# Patient Record
Sex: Female | Born: 1985 | Race: White | Hispanic: No | Marital: Single | Smoking: Never smoker
Health system: Southern US, Community
[De-identification: ages and names within clinical notes are randomized; demographics above are authoritative.]

---

## 2013-05-12 ENCOUNTER — Ambulatory Visit (INDEPENDENT_AMBULATORY_CARE_PROVIDER_SITE_OTHER): Payer: BC Managed Care – PPO | Admitting: Internal Medicine

## 2013-05-12 ENCOUNTER — Encounter (INDEPENDENT_AMBULATORY_CARE_PROVIDER_SITE_OTHER): Payer: Self-pay

## 2013-05-12 VITALS — BP 144/87 | HR 80 | Temp 98.8°F | Resp 20 | Ht 69.0 in | Wt 190.0 lb

## 2013-05-12 LAB — POCT URINALYSIS DIPSTIX (10)(MULTI-TEST)
Bilirubin, UA POCT: NEGATIVE
Glucose, UA POCT: NEGATIVE mg/dL
Ketones, UA POCT: NEGATIVE mg/dL
Nitrite, UA POCT: NEGATIVE
POCT Spec Gravity, UA: 1.01 (ref 1.001–1.035)
POCT pH, UA: 7 (ref 5–8)
Protein, UA POCT: NEGATIVE mg/dL
Urobilinogen, UA: 0.2 mg/dL

## 2013-05-12 MED ORDER — NITROFURANTOIN MONOHYD MACRO 100 MG PO CAPS
100.0000 mg | ORAL_CAPSULE | Freq: Two times a day (BID) | ORAL | Status: AC
Start: 2013-05-12 — End: 2013-05-17

## 2013-05-12 NOTE — Progress Notes (Signed)
Subjective:       Patient ID: Barbara Hess is a 27 y.o. female.    HPI  Chief Complaint   Patient presents with   . Urinary Tract Infection Symptoms     c/o symptoms started 05/10/13, with pain after urination, burning, urgency. patient has a history of UTI.       As above.  Dysuria, urgency and frequency.  No fever or chills.  1 episode of UTI in past.  No history of kidney infection.  No abd pain or flank pain.  Sexually active.    The following portions of the patient's history were reviewed and updated as appropriate: allergies, current medications, past family history, past medical history, past social history, past surgical history and problem list.    Review of Systems   Constitutional: Negative for fever, chills, activity change and appetite change.   Respiratory: Negative for shortness of breath.    Cardiovascular: Negative for chest pain.   Gastrointestinal: Negative for nausea, vomiting, abdominal pain, diarrhea and constipation.   Genitourinary: Positive for dysuria, urgency and frequency.   Neurological: Negative for dizziness, light-headedness and headaches.   All other systems reviewed and are negative.            Objective:     Physical Exam   Nursing note and vitals reviewed.  Constitutional: She appears well-developed and well-nourished.   Cardiovascular: Normal rate, regular rhythm, normal heart sounds and intact distal pulses.    No murmur heard.  Pulmonary/Chest: Effort normal and breath sounds normal. No respiratory distress. She has no wheezes. She has no rales.   Abdominal: Soft. Bowel sounds are normal. She exhibits no distension. There is no tenderness. There is no rebound and no guarding.   Genitourinary:        No CVA tenderness   Skin: Skin is warm and dry.     UA - large LE, trace blood      Assessment:       UTI      Plan:       Encouraged hydration.  Symptomatic care with cranberry juice/pyridium OTC as needed.  Antibiotics as prescribed.   Discussed warning symptoms such as  worsening pain, no improvement despite antibiotics, fever/chills, flank pain that would warrant immediate medical attention.  Education provided regarding genital hygiene and pathophysiology of UTI in women discussed.

## 2013-05-12 NOTE — Patient Instructions (Signed)
Bladder Infection, Female (Adult)    A bladder infection ("cystitis" or "UTI") usually causes a constant urge to urinate and a burning when passing urine. Urine may be cloudy, smelly or dark. There may be pain in the lower abdomen. A bladder infection occurs when bacteria from the vaginal area enter the bladder opening (urethra). This can occur from sexual intercourse, wearing tight clothing, dehydration and other factors.  Home Care:   Drink lots of fluids (at least 6-8 glasses a day, unless you must restrict fluids for other medical reasons). This will force the medicine into your urinary system and flush the bacteria out of your body.   Avoid sexual intercourse until your symptoms are gone.   Avoid caffeine, alcohol and spicy foods. These can irritate the bladder.   A bladder infection is treated with antibiotics. You may also be given Pyridium (generic = phenazopyridine) to reduce the burning sensation. This medicine will cause your urine to become a bright orange color. The orange urine may stain clothing. You may wear a pad or panty-liner to protect clothing.  Preventing Future Infections:   Always wipe from front to back after a bowel movement.   Keep the genital area clean and dry.   Drink plenty of fluids each day to avoid dehydration.   Both sexual partners should wash before intercourse.   Urinate right after intercourse to flush out the bladder.   Wear cotton underwear and cotton-lined panty hose; avoid tight-fitting pants.   If you are on birth control pills and are having frequent bladder infections, discuss with your doctor.  Follow Up:  Return to this facility or see your doctor if ALL symptoms are not gone after three days of treatment.  Get Prompt Medical Attention  if any of the following occur:   Fever of 100.4F (38C) or higher, or as directed by your healthcare provider   No improvement by the third day of treatment   Increasing back or abdominal pain   Repeated vomiting;  unable to keep medicine down   Weakness, dizziness or fainting   Vaginal discharge   Pain, redness or swelling in the labia (outer vaginal area)   2000-2014 [employer], [address], [place] [address]. All rights reserved. This information is not intended as a substitute for professional medical care. Always follow your healthcare professional's instructions.

## 2013-08-18 ENCOUNTER — Encounter (INDEPENDENT_AMBULATORY_CARE_PROVIDER_SITE_OTHER): Payer: Self-pay | Admitting: Family Medicine

## 2013-08-18 ENCOUNTER — Ambulatory Visit (INDEPENDENT_AMBULATORY_CARE_PROVIDER_SITE_OTHER): Payer: BC Managed Care – PPO | Admitting: Family Medicine

## 2013-08-18 VITALS — BP 142/89 | HR 81 | Temp 98.5°F | Resp 19 | Ht 69.0 in | Wt 202.6 lb

## 2013-08-18 MED ORDER — NORGESTIMATE-ETH ESTRADIOL 0.25-35 MG-MCG PO TABS
1.0000 | ORAL_TABLET | Freq: Every day | ORAL | Status: DC
Start: 2013-08-18 — End: 2014-02-10

## 2013-08-18 NOTE — Progress Notes (Signed)
Subjective:       Patient ID: Barbara Hess is a 27 y.o. female.    HPI    Barbara Hess is a 27 y.o. female who comes in today for refill of OC's. Just moved here from [facility]. [facility]  Gets nervous at new doctor's visits.    HPI        Active Problems  Patient Active Problem List    Diagnosis Date Noted   . Elevated BP 08/18/2013       Past Surgical History  History reviewed. No pertinent past surgical history.    Medications  Current Outpatient Prescriptions   Medication Sig Dispense Refill   . norgestimate-ethinyl estradiol (ORTHO-CYCLEN) 0.25-35 MG-MCG per tablet Take 1 tablet by mouth daily.           Allergies  No Known Allergies    Gyn History    Obstetric History   G0P0     Patient's last menstrual period was 08/15/2013.    Social History  History     Social History   . Marital Status: Single     Spouse Name: N/A     Number of Children: N/A   . Years of Education: 16     Occupational History   . Admin     Social History Main Topics   . Smoking status: Never Smoker    . Smokeless tobacco: Never Used   . Alcohol Use: 1.8 oz/week     3 Glasses of wine per week   . Drug Use: No   . Sexually Active: Yes     Birth Control/ Protection: Condom, OCP     Other Topics Concern   . Not on file     Social History Narrative   . No narrative on file       Family History  Family History   Problem Relation Age of Onset   . Hyperlipidemia Mother        Review of Systems  CONST: No wt change, no f/c/s  EYES:  No blurry vision, double vision, loss of peripheral vision  HENT:  No hearing loss, tinnitus, allergies, teeth problems  CV:   No CP, palpitations, leg swelling  RESP:  No SOB, cough, wheeze  GI:   No n/v, diarrhea, constipation, abd pain, heartburn, blood in stool  GU:   No dysuria, frequency, incontinence, breast changes, abnormal vaginal bleeding or discharge  MS:  No joint or muscle pain  SKIN:   No rashes or concerning moles  ENDO:  No heat/cold intolerance,  menstrual changes, polyuria, polydipsia  HEME:  No bruising/bleeding, swollen glands  NEURO:  No HA, LH, dizziness, numbness/tingling, weakness  PSYCH:  No depressed mood, anhedonia, anxiety            Review of Systems        Objective:    Physical Exam      Physical Exam:  BP 142/89  Pulse 81  Temp 98.5 F (36.9 C) (Oral)  Resp 19  Ht 1.753 m (5\' 9" )  Wt 91.899 kg (202 lb 9.6 oz)  BMI 29.91 kg/m2  SpO2 98%  LMP 08/15/2013  Wt Readings from Last 3 Encounters:   08/18/13 91.899 kg (202 lb 9.6 oz)   05/12/13 86.183 kg (190 lb)     BP Readings from Last 3 Encounters:   08/18/13 142/89   05/12/13 144/87     CONST: NAD, normal weight  HENT: B TMs normal, OP clear, normal dentition  EYES: PERRL, no pallor or scleral icterus, conjunctiva not injected  NECK: supple, no TM or nodules  LYMPH: no cervical or supraclavicular LAD  CV: RRR, no m/r/g.  No LE edema.  Pedal pulses present and equal.  RESP: CTAB, normal effort  GI: soft, NT/ND, NABS, no HSM  SKIN: warm, dry.  no visible rashes or concerning moles  MSK: Grossly normal ROM and strength in all 4 extremities  NEURO: Cranial nerves 2-12 intact, normal gait  PSYCH: A&O x3, normal mood and affect    Assessment:       1. Elevated BP     2. Birth control  norgestimate-ethinyl estradiol (ORTHO-CYCLEN) 0.25-35 MG-MCG per tablet           Plan:       Risks & benefits of the new medication(s) were explained to the pt, who appeared to understand and agrees to the treatment plan.  Return for bp check

## 2013-08-18 NOTE — Progress Notes (Signed)
Has the patient sought any care outside of the [facility]? NO    -Refer to care team-   Or  List Specialists:

## 2013-08-18 NOTE — Patient Instructions (Signed)
Eating Heart-Healthy Food: Using the DASH Plan  Eating for your heart doesn't have to be hard or boring. You just need to know how to make healthier choices. The DASH eating plan has been developed to help you do just that. DASH stands for Dietary Approaches to Stop Hypertension. It is a plan that has been proven to be healthier for your heart and to lower your risk for high blood pressure. It can also help lower your risk for cancer, heart disease, osteoporosis, and diabetes.    Choosing from Each Food Group  Choose foods from each of the food groups below each day. Try to get the recommended number of servings for each food group. The serving numbers are based on a diet of 2,000 calories a day.Talk to your doctor if you're unsure about your calorie needs.      Grains  Servings: 7-8 a day  A serving is:   1 slice bread   1 ounce dry cereal   half a cup cooked rice or pasta  Best choices: Whole grains and any grains high in fiber. Vegetables  Servings: 4-5 a day  A serving is:   1 cup raw leafy vegetable   Half a cup cooked vegetable   Three-quarter cup vegetable juice  Best choices: Fresh or frozen vegetable prepared without too much added salt or fat.   Fruits  Servings: 4-5 a day  A serving is:   Three-quarter cup fruit juice   1 medium fruit   One-quarter cup dried fruit   One-half cup fresh, frozen, or canned fruit  Best choices: A variety of fresh fruits of different colors. Whole fruits are a much better choice than fruit juices. Low-fat or Fat Free Dairy  Servings: 2-3 a day  A serving is:   8 ounces milk   1 cup yogurt   One and a half ounces cheese  Best choices: Skim or 1% milk, low-fat or fat free yogurt or buttermilk, and low-fat cheeses.             Meat, Poultry, Fish  Servings: 2 or fewer a day  A serving is:   3 ounces cooked meat, poultry, or fish  Best choices: Lean meats and fish. Trim away visible fat. Broil, roast, or boil instead of frying. Remove skin from poultry  before eating. Nuts, Seeds, Beans  Servings: 4-5 a week  A serving is:   One third cup nuts (or one and a half ounces)   2 tablespoons sunflower seeds   Half a cup cooked beans  Best choices: "Dry roasted" nuts with no salt added, lentils, kidney beans, garbanzo beans, and whole pinto beans.   Fats and Oils  Servings: 2 a day  A serving is:   1 teaspoon vegetable oil   1 teaspoon soft margarine   1 tablespoon low-fat mayonnaise   1 teaspoon regular mayonnaise   2 tablespoons light salad dressing   1 tablespoon regular salad dressing  Best choices: Monounsaturated and polyunsaturated fats such as olive, canola, or safflower oil. Sweets  Servings: 5 a week or fewer  A serving is:   1 tablespoon sugar, maple syrup, or honey   1 tablespoon jam or jelly   1 half-ounce jelly beans (about 15)   8 ounces lemonade  Best choices: Dried fruit can be a satisfying sweet. Choose low-fat sweets when possible. And watch your serving sizes!     For more on the DASH eating plan, visit:  www.nhlbi.nih.gov/health/health-topics/topics/dash      2000-2014 [employer], [address], [place] [address]. All rights reserved. This information is not intended as a substitute for professional medical care. Always follow your healthcare professional's instructions.

## 2013-08-31 ENCOUNTER — Other Ambulatory Visit (INDEPENDENT_AMBULATORY_CARE_PROVIDER_SITE_OTHER): Payer: Self-pay | Admitting: Family Medicine

## 2013-08-31 NOTE — Telephone Encounter (Signed)
Patient would like to know if there is a way we can send in the brand name for  norgestimate-ethinyl estradiol (ORTHO-CYCLEN) which is Mononessa.  Dr Casimer Leek has sent in the generic but patient would recommend the brand rx instead.  Please let me know if you could do it, if so then please [facility].

## 2014-02-09 ENCOUNTER — Telehealth (INDEPENDENT_AMBULATORY_CARE_PROVIDER_SITE_OTHER): Payer: Self-pay | Admitting: Family Medicine

## 2014-02-09 NOTE — Telephone Encounter (Signed)
Pt is calling to get a refill on the following medication norgestimate-ethinyl estradiol (ORTHO-CYCLEN) 0.25-35 MG-MCG per tablet. She would like for it to be sent to [facility] [id number] - [place], [place] - [address] AT [facility] [phone] (Phone)  [phone] (Fax).

## 2014-02-10 ENCOUNTER — Other Ambulatory Visit (INDEPENDENT_AMBULATORY_CARE_PROVIDER_SITE_OTHER): Payer: Self-pay | Admitting: Family Medicine

## 2014-02-10 DIAGNOSIS — IMO0001 Reserved for inherently not codable concepts without codable children: Secondary | ICD-10-CM

## 2014-02-10 MED ORDER — NORGESTIMATE-ETH ESTRADIOL 0.25-35 MG-MCG PO TABS
1.00 | ORAL_TABLET | Freq: Every day | ORAL | Status: AC
Start: 2014-02-10 — End: ?

## 2014-02-13 NOTE — Telephone Encounter (Deleted)
Pt requesting the (norgestimate-ethinyl estradiol (ORTHO-CYCLEN) 0.25-35 MG-MCG per tablet) be sent to   [facility]   [address] [place], [place] [address]  [phone]  [facility] is trying to give Patient a different type of Birth Control Rx

## 2020-02-23 ENCOUNTER — Emergency Department (HOSPITAL_COMMUNITY): Payer: Managed Care, Other (non HMO)

## 2020-02-23 ENCOUNTER — Encounter (HOSPITAL_COMMUNITY): Payer: Self-pay | Admitting: Emergency Medicine

## 2020-02-23 ENCOUNTER — Other Ambulatory Visit: Payer: Self-pay

## 2020-02-23 ENCOUNTER — Emergency Department (HOSPITAL_COMMUNITY)
Admission: EM | Admit: 2020-02-23 | Discharge: 2020-02-23 | Disposition: A | Discharge status: Home / Self Care | Payer: Managed Care, Other (non HMO) | Attending: Emergency Medicine | Admitting: Emergency Medicine

## 2020-02-23 DIAGNOSIS — Y93E1 Activity, personal bathing and showering: Secondary | ICD-10-CM | POA: Insufficient documentation

## 2020-02-23 DIAGNOSIS — S0990XA Unspecified injury of head, initial encounter: Secondary | ICD-10-CM | POA: Diagnosis not present

## 2020-02-23 DIAGNOSIS — Y999 Unspecified external cause status: Secondary | ICD-10-CM | POA: Diagnosis not present

## 2020-02-23 DIAGNOSIS — W010XXA Fall on same level from slipping, tripping and stumbling without subsequent striking against object, initial encounter: Secondary | ICD-10-CM | POA: Insufficient documentation

## 2020-02-23 DIAGNOSIS — S0181XA Laceration without foreign body of other part of head, initial encounter: Secondary | ICD-10-CM | POA: Diagnosis present

## 2020-02-23 DIAGNOSIS — R030 Elevated blood-pressure reading, without diagnosis of hypertension: Secondary | ICD-10-CM

## 2020-02-23 DIAGNOSIS — Y92002 Bathroom of unspecified non-institutional (private) residence single-family (private) house as the place of occurrence of the external cause: Secondary | ICD-10-CM | POA: Insufficient documentation

## 2020-02-23 DIAGNOSIS — I10 Essential (primary) hypertension: Secondary | ICD-10-CM | POA: Insufficient documentation

## 2020-02-23 DIAGNOSIS — W19XXXA Unspecified fall, initial encounter: Secondary | ICD-10-CM

## 2020-02-23 MED ORDER — ACETAMINOPHEN 500 MG PO TABS
1000.0000 mg | ORAL_TABLET | Freq: Once | ORAL | Status: AC
  Administered 2020-02-23: 1000 mg via ORAL
  Filled 2020-02-23: qty 2

## 2020-02-23 MED ORDER — LIDOCAINE-EPINEPHRINE-TETRACAINE (LET) TOPICAL GEL
3.0000 mL | Freq: Once | TOPICAL | Status: AC
  Administered 2020-02-23: 3 mL via TOPICAL
  Filled 2020-02-23: qty 3

## 2020-02-23 MED ORDER — IBUPROFEN 800 MG PO TABS: 800.0000 mg | ORAL_TABLET | Freq: Three times a day (TID) | ORAL | 0 refills | Status: DC

## 2020-02-23 NOTE — ED Notes (Signed)
Dr. Palumbo at bedside. 

## 2020-02-23 NOTE — ED Notes (Signed)
Dr. Nicanor Alcon aware of pt BP on discharge.

## 2020-02-23 NOTE — ED Triage Notes (Signed)
Patient fell in shower and hit her head. Patient has a laceration on the front neck.

## 2020-02-23 NOTE — ED Provider Notes (Signed)
[place] DEPT Provider Note   CSN: [id number] Arrival date & time: 02/23/20  0045     History Chief Complaint  Patient presents with  . Head Laceration    Majorie Kim is a 34 y.o. female.  The history is provided by the patient.  Head Laceration This is a new problem. The current episode started less than 1 hour ago. The problem occurs constantly. The problem has not changed since onset.Pertinent negatives include no chest pain, no abdominal pain, no headaches and no shortness of breath. Nothing aggravates the symptoms. Nothing relieves the symptoms. She has tried nothing for the symptoms. The treatment provided no relief.  Patient with reported "white coat hypertension" presents with fall in the shower this evening.  She struck her head.  No LOC.  Laceration over the left eyebrow.  No vomiting.  No seizure like activity.      History reviewed. No pertinent past medical history.  There are no problems to display for this patient.   History reviewed. No pertinent surgical history.   OB History   No obstetric history on file.     History reviewed. No pertinent family history.  Social History   Tobacco Use  . Smoking status: Never Smoker  . Smokeless tobacco: Never Used  Substance Use Topics  . Alcohol use: Not on file  . Drug use: Not on file    Home Medications Prior to Admission medications   Not on File    Allergies    Patient has no known allergies.  Review of Systems   Review of Systems  Constitutional: Negative for fever.  HENT: Negative for congestion.   Eyes: Negative for visual disturbance.  Respiratory: Negative for shortness of breath.   Cardiovascular: Negative for chest pain.  Gastrointestinal: Negative for abdominal pain.  Genitourinary: Negative for difficulty urinating.  Musculoskeletal: Negative for back pain and neck pain.  Skin: Positive for wound. Negative for rash.  Neurological: Negative for  seizures, speech difficulty, weakness, numbness and headaches.  Psychiatric/Behavioral: Negative for agitation.  All other systems reviewed and are negative.   Physical Exam Updated Vital Signs BP (!) 197/123 (BP Location: Left Arm)   Pulse 92   Temp 98.1 F (36.7 C) (Oral)   Resp 20   Ht 5\' 9"  (1.753 m)   Wt 99.8 kg   SpO2 98%   BMI 32.49 kg/m   Physical Exam Vitals and nursing note reviewed.  Constitutional:      General: She is not in acute distress.    Appearance: Normal appearance.  HENT:     Head: Normocephalic. No raccoon eyes or Battle's sign.     Jaw: No trismus.      Nose: Nose normal.  Eyes:     Extraocular Movements: Extraocular movements intact.     Conjunctiva/sclera: Conjunctivae normal.     Pupils: Pupils are equal, round, and reactive to light.  Cardiovascular:     Rate and Rhythm: Normal rate and regular rhythm.     Pulses: Normal pulses.     Heart sounds: Normal heart sounds.  Pulmonary:     Effort: Pulmonary effort is normal.     Breath sounds: Normal breath sounds.  Abdominal:     General: Abdomen is flat. Bowel sounds are normal.     Tenderness: There is no abdominal tenderness. There is no guarding or rebound.  Musculoskeletal:        General: Normal range of motion.     Cervical back: Normal  range of motion and neck supple.  Skin:    General: Skin is warm and dry.     Capillary Refill: Capillary refill takes less than 2 seconds.  Neurological:     General: No focal deficit present.     Mental Status: She is alert and oriented to person, place, and time.     Deep Tendon Reflexes: Reflexes normal.  Psychiatric:        Mood and Affect: Mood normal.        Behavior: Behavior normal.     ED Results / Procedures / Treatments   Labs (all labs ordered are listed, but only abnormal results are displayed) Labs Reviewed - No data to display  EKG None  Radiology CT Head Wo Contrast  Result Date: 02/23/2020 CLINICAL DATA:  Fall insert  however, laceration to the left forehead EXAM: CT HEAD WITHOUT CONTRAST TECHNIQUE: Contiguous axial images were obtained from the base of the skull through the vertex without intravenous contrast. COMPARISON:  None. FINDINGS: Brain: No evidence of acute territorial infarction, hemorrhage, hydrocephalus,extra-axial collection or mass lesion/mass effect. Normal gray-white differentiation. Ventricles are normal in size and contour. Vascular: No hyperdense vessel or unexpected calcification. Skull: The skull is intact. No fracture or focal lesion identified. Sinuses/Orbits: The visualized paranasal sinuses and mastoid air cells are clear. The orbits and globes intact. Other: None Cervical spine: Alignment: Physiologic Skull base and vertebrae: Visualized skull base is intact. No atlanto-occipital dissociation. The vertebral body heights are well maintained. No fracture or pathologic osseous lesion seen. Soft tissues and spinal canal: The visualized paraspinal soft tissues are unremarkable. No prevertebral soft tissue swelling is seen. The spinal canal is grossly unremarkable, no large epidural collection or significant canal narrowing. Disc levels:  No significant canal or neural foraminal narrowing. Upper chest: The lung apices are clear. Thoracic inlet is within normal limits. Other: None IMPRESSION: No acute intracranial abnormality. No acute fracture or malalignment of the spine. Electronically Signed   By: Jonna Clark M.D.   On: 02/23/2020 02:18   CT Cervical Spine Wo Contrast  Result Date: 02/23/2020 CLINICAL DATA:  Fall insert however, laceration to the left forehead EXAM: CT HEAD WITHOUT CONTRAST TECHNIQUE: Contiguous axial images were obtained from the base of the skull through the vertex without intravenous contrast. COMPARISON:  None. FINDINGS: Brain: No evidence of acute territorial infarction, hemorrhage, hydrocephalus,extra-axial collection or mass lesion/mass effect. Normal gray-white  differentiation. Ventricles are normal in size and contour. Vascular: No hyperdense vessel or unexpected calcification. Skull: The skull is intact. No fracture or focal lesion identified. Sinuses/Orbits: The visualized paranasal sinuses and mastoid air cells are clear. The orbits and globes intact. Other: None Cervical spine: Alignment: Physiologic Skull base and vertebrae: Visualized skull base is intact. No atlanto-occipital dissociation. The vertebral body heights are well maintained. No fracture or pathologic osseous lesion seen. Soft tissues and spinal canal: The visualized paraspinal soft tissues are unremarkable. No prevertebral soft tissue swelling is seen. The spinal canal is grossly unremarkable, no large epidural collection or significant canal narrowing. Disc levels:  No significant canal or neural foraminal narrowing. Upper chest: The lung apices are clear. Thoracic inlet is within normal limits. Other: None IMPRESSION: No acute intracranial abnormality. No acute fracture or malalignment of the spine. Electronically Signed   By: Jonna Clark M.D.   On: 02/23/2020 02:18    Procedures .Marland KitchenLaceration Repair  Date/Time: 02/23/2020 3:08 AM Performed by: Cy Blamer, MD Authorized by: Cy Blamer, MD   Consent:  Consent obtained:  Verbal   Consent given by:  Patient   Risks discussed:  Infection, need for additional repair, nerve damage, pain, poor cosmetic result, poor wound healing, tendon damage, vascular damage and retained foreign body   Alternatives discussed:  No treatment and delayed treatment Anesthesia (see MAR for exact dosages):    Anesthesia method:  Topical application   Topical anesthetic:  LET Laceration details:    Location:  Face   Face location:  Forehead (just above the left eyebrow)   Length (cm):  3   Depth (mm):  1 Repair type:    Repair type:  Simple Pre-procedure details:    Preparation:  Patient was prepped and draped in usual sterile  fashion Exploration:    Hemostasis achieved with:  Direct pressure and LET   Wound exploration: wound explored through full range of motion     Wound extent: no areolar tissue violation noted     Contaminated: no   Treatment:    Wound cleansed with: chlorhexidine.   Amount of cleaning:  Extensive Skin repair:    Repair method:  Tissue adhesive Approximation:    Approximation:  Close Post-procedure details:    Dressing:  Sterile dressing   Patient tolerance of procedure:  Tolerated well, no immediate complications   (including critical care time)  Medications Ordered in ED Medications  lidocaine-EPINEPHrine-tetracaine (LET) topical gel (3 mLs Topical Given 02/23/20 0121)  acetaminophen (TYLENOL) tablet 1,000 mg (1,000 mg Oral Given 02/23/20 0121)    ED Course  I have reviewed the triage vital signs and the nursing notes.  Pertinent labs & imaging results that were available during my care of the patient were reviewed by me and considered in my medical decision making (see chart for details).   Wound with excellent approximation.  Dressing applied.  Alternate tylenol and ibuprofen.  I have advised the patient to follow up with PMD for "white coat HTN" as her BP needs to be checked to ensure this is not essential hypertension.  Patient verbalizes understanding and agrees to follow up.    Mellissa Conley was evaluated in Emergency Department on 02/23/2020 for the symptoms described in the history of present illness. She was evaluated in the context of the global COVID-19 pandemic, which necessitated consideration that the patient might be at risk for infection with the SARS-CoV-2 virus that causes COVID-19. Institutional protocols and algorithms that pertain to the evaluation of patients at risk for COVID-19 are in a state of rapid change based on information released by regulatory bodies including the CDC and federal and state organizations. These policies and algorithms were followed  during the patient's care in the ED.  Final Clinical Impression(s) / ED Diagnoses Final diagnoses:  Laceration of other part of head without foreign body, initial encounter  Fall, initial encounter   Return for weakness, numbness, changes in vision or speech, fevers >100.4 unrelieved by medication, shortness of breath, intractable vomiting, or diarrhea, abdominal pain, Inability to tolerate liquids or food, cough, altered mental status or any concerns. No signs of systemic illness or infection. The patient is nontoxic-appearing on exam and vital signs are within normal limits.   I have reviewed the triage vital signs and the nursing notes. Pertinent labs &imaging results that were available during my care of the patient were reviewed by me and considered in my medical decision making (see chart for details).  After history, exam, and medical workup I feel the patient has been appropriately medically screened and is safe  for discharge home. Pertinent diagnoses were discussed with the patient. Patient was givenstrictreturn precautions.   Jonnie Kubly, MD 02/23/20 [phone]

## 2020-02-23 NOTE — ED Notes (Signed)
Pt ambulatory to and from bathroom with a steady gait.  

## 2020-02-27 ENCOUNTER — Encounter: Payer: Self-pay | Admitting: Plastic Surgery

## 2020-02-27 ENCOUNTER — Other Ambulatory Visit: Payer: Self-pay

## 2020-02-27 ENCOUNTER — Ambulatory Visit: Payer: Managed Care, Other (non HMO) | Admitting: Plastic Surgery

## 2020-02-27 VITALS — BP 174/124 | HR 90 | Temp 97.3°F | Ht 69.0 in | Wt 226.0 lb

## 2020-02-27 DIAGNOSIS — S0181XA Laceration without foreign body of other part of head, initial encounter: Secondary | ICD-10-CM | POA: Diagnosis not present

## 2020-02-27 NOTE — Progress Notes (Signed)
Referring Provider No referring provider defined for this encounter.   CC:  Chief Complaint  Patient presents with  . Advice Only    for scar on forehead      Monique Kim is an 34 y.o. female.  HPI: Patient is about 5 days out from a laceration to her left forehead.  She slipped in the bathroom and hit her head on a tile.  She stained a laceration was seen in the emergency room where Dermabond was applied.  Since then she has had intermittent bleeding and quite a bit of scabbing is formed over the area.  She feels like the bruising and swelling has gone down and has some degree of pain but is primarily concerned with the degree of scarring and is interested in discussing ways to minimize this if possible.  No Known Allergies  Outpatient Encounter Medications as of 02/27/2020  Medication Sig  . clonazePAM (KLONOPIN) 0.5 MG tablet Take 0.5 mg by mouth daily as needed.  Marland Kitchen ibuprofen (ADVIL) 800 MG tablet Take 1 tablet (800 mg total) by mouth 3 (three) times daily.  . Multiple Vitamin (MULTIVITAMIN) tablet Take 2 tablets by mouth daily.  . norgestimate-ethinyl estradiol (SPRINTEC 28) 0.25-35 MG-MCG tablet Take 1 tablet by mouth daily.  Marland Kitchen VIIBRYD 20 MG TABS Take 30mg  tablet in the morning.   No facility-administered encounter medications on file as of 02/27/2020.     No past medical history on file.  No past surgical history on file.  No family history on file.  Social History   Social History Narrative  . Not on file     Review of Systems General: Denies fevers, chills, weight loss CV: Denies chest pain, shortness of breath, palpitations  Physical Exam Vitals with BMI 02/27/2020 02/27/2020 02/23/2020  Height - 5\' 9"  -  Weight - 226 lbs -  BMI - 79.02 -  Systolic 409 735 329  Diastolic 924 268 341  Pulse - 90 88    General:  No acute distress,  Alert and oriented, Non-Toxic, Normal speech and affect HEENT: Normocephalic.  She has a 4 to 4.5 cm transverse laceration  superior to and parallel to the left eyebrow.  There is quite a bit of adherent Dermabond and scab some of which I removed to try to get a better look at the laceration line.  She can raise her eyebrow on both sides.  There is a small amount of bruising that looks to be fading nicely.  She reports normal vision and normal extraocular movements.  There is no other signs of trauma to her face.  Assessment/Plan Had a long discussion with the patient regarding the scarring process and how to move forward.  She did show me a picture of the laceration shortly after it was glued and this appeared to be reasonably well approximated.  It is a bit difficult to get a good look at the laceration edges today given the Dermabond and scabbing.  I elected not to aggressively remove this for fear of separating the wound edges.  I explained that within a couple weeks most of the Dermabond and scabbing should flake away.  This will reveal a fresh scar that will take time to mature and fade.  I discussed the process with her.  I explained that in the short-term sunscreen and silicone scar creams would be helpful.  I explained it would be unlikely that I would entertain any sort of revision procedure for least 6 months and probably longer.  Patient can come back at any time am happy to look at this again in the future if she is interested.  Monique Kim 02/27/2020, 8:57 PM

## 2020-05-17 ENCOUNTER — Ambulatory Visit (HOSPITAL_COMMUNITY)
Admission: EM | Admit: 2020-05-17 | Discharge: 2020-05-17 | Disposition: A | Discharge status: Home / Self Care | Payer: Managed Care, Other (non HMO) | Attending: Urgent Care | Admitting: Urgent Care

## 2020-05-17 ENCOUNTER — Encounter (HOSPITAL_COMMUNITY): Payer: Self-pay

## 2020-05-17 ENCOUNTER — Ambulatory Visit (HOSPITAL_COMMUNITY): Payer: Self-pay

## 2020-05-17 ENCOUNTER — Other Ambulatory Visit: Payer: Self-pay

## 2020-05-17 DIAGNOSIS — R030 Elevated blood-pressure reading, without diagnosis of hypertension: Secondary | ICD-10-CM | POA: Diagnosis present

## 2020-05-17 DIAGNOSIS — I10 Essential (primary) hypertension: Secondary | ICD-10-CM | POA: Diagnosis present

## 2020-05-17 DIAGNOSIS — R635 Abnormal weight gain: Secondary | ICD-10-CM | POA: Diagnosis present

## 2020-05-17 LAB — COMPREHENSIVE METABOLIC PANEL
ALT: 14 U/L (ref 0–44)
AST: 22 U/L (ref 15–41)
Albumin: 3.5 g/dL (ref 3.5–5.0)
Alkaline Phosphatase: 44 U/L (ref 38–126)
Anion gap: 11 (ref 5–15)
BUN: 11 mg/dL (ref 6–20)
CO2: 26 mmol/L (ref 22–32)
Calcium: 9.2 mg/dL (ref 8.9–10.3)
Chloride: 101 mmol/L (ref 98–111)
Creatinine, Ser: 0.97 mg/dL (ref 0.44–1.00)
GFR calc Af Amer: >60 mL/min (ref >60)
GFR calc non Af Amer: >60 mL/min (ref >60)
Glucose, Bld: 117 mg/dL — ABNORMAL HIGH (ref 70–99)
Potassium: 3.4 mmol/L — ABNORMAL LOW (ref 3.5–5.1)
Sodium: 138 mmol/L (ref 135–145)
Total Bilirubin: 1 mg/dL (ref 0.3–1.2)
Total Protein: 7.1 g/dL (ref 6.5–8.1)

## 2020-05-17 MED ORDER — AMLODIPINE BESYLATE 5 MG PO TABS: 5.0000 mg | ORAL_TABLET | Freq: Every day | ORAL | 0 refills | Status: AC

## 2020-05-17 NOTE — Discharge Instructions (Addendum)
Check your blood pressure daily around the same time each day. Make sure you are in a seated position, relaxed and still for ~5 minutes. If you develop chest pain, headache, confusion, blood in the urine then report to the ER immediately by ambulance or if your symptoms are not severe then by having your husband drive you there. Your blood pressure should be 110-130 systolic as a goal. As we start you on medication we want to bring it down slowly and add medications on until you are at a good level. I would say, after a week if you have no symptoms from taking the medication then increase your amlodipine dose to 10mg  (2 tablets daily).     For diabetes or elevated blood sugar, please make sure you are avoiding starchy, carbohydrate foods like pasta, breads, pastry, rice, potatoes, desserts. These foods can elevated your blood sugar. Also, avoid sodas, sweet teas, sugary beverages, fruit juices.  Drinking plain water will be much more helpful, try 64 ounces of water daily.  It is okay to flavor your water naturally by cutting cucumber, lemon, mint or lime, placing it in a picture with water and drinking it over a period of 2 to 3 days as long as it remains refrigerated.    For elevated blood pressure, make sure you are monitoring salt in your diet.  Do not eat restaurant foods and limit processed foods at home, prepare/cook your own foods at home.  Processed foods include things like frozen meals preseasoned meats and dinners, deli meats, canned foods as they are high in sodium/salt.  Make sure your pain attention to sodium labels on foods you by at the grocery store.  For seasoning you can use a brand called Mrs. Dash which includes a lot of salt free seasonings.  Salads - kale, spinach, cabbage, spring mix; use seeds like pumpkin seeds or sunflower seeds, almonds, walnuts or pecans; you can also use 1-2 hard boiled eggs in your salads Fruits - avocadoes, berries (blueberries, raspberries, blackberries),  apples, oranges, pomegranate, pear; avoid eating bananas, grapes regularly Vegetables - aspargus, cauliflower, broccoli, green beans, brussel spouts, bell peppers; stay away from starchy vegetables like potatoes, carrots, peas  Regarding meat it is better to eat lean meats and limit your red meat including pork to once a week.  Wild caught fish, chicken breast are good options as they tend to be leaner sources of good protein.   DO NOT EAT ANY FOODS ON THIS LIST THAT YOU ARE ALLERGIC TO.

## 2020-05-17 NOTE — ED Provider Notes (Signed)
[facility]   MRN: [id number] DOB: 19-May-1986  Subjective:   Monique Kim is a 34 y.o. female presenting for check on her blood pressure.  Patient was at her gynecologist for regular checkup, refill of her birth control and was noted to have severely elevated blood pressure.  She does not have a PCP since moving down here from [place] recently.  The emphasized coming to our clinic to have an evaluation.  Patient states that over the past year she has been under a lot of stress with anxiety and depression, COVID-19 pandemic and quarantining.  Has gained 30 to 40 pounds.  Was previously very physically active and exercise regularly but this also affected her since she stopped working out.  No current facility-administered medications for this encounter.  Current Outpatient Medications:  .  clonazePAM (KLONOPIN) 0.5 MG tablet, Take 0.5 mg by mouth daily as needed., Disp: , Rfl:  .  ibuprofen (ADVIL) 800 MG tablet, Take 1 tablet (800 mg total) by mouth 3 (three) times daily., Disp: 21 tablet, Rfl: 0 .  Multiple Vitamin (MULTIVITAMIN) tablet, Take 2 tablets by mouth daily., Disp: , Rfl:  .  norgestimate-ethinyl estradiol (SPRINTEC 28) 0.25-35 MG-MCG tablet, Take 1 tablet by mouth daily., Disp: , Rfl:  .  VIIBRYD 20 MG TABS, Take 30mg  tablet in the morning., Disp: , Rfl:    No Known Allergies  PMH anxiety.   History reviewed. No pertinent surgical history.  Family History  Family history unknown: Yes    Social History   Tobacco Use  . Smoking status: Never Smoker  . Smokeless tobacco: Never Used  Substance Use Topics  . Alcohol use: Not on file  . Drug use: Not on file    Review of Systems  Constitutional: Negative for fever and malaise/fatigue.  HENT: Negative for congestion, ear pain, sinus pain and sore throat.   Eyes: Negative for blurred vision, double vision, discharge and redness.  Respiratory: Negative for cough, hemoptysis, shortness of breath and wheezing.     Cardiovascular: Negative for chest pain.  Gastrointestinal: Negative for abdominal pain, diarrhea, nausea and vomiting.  Genitourinary: Negative for dysuria, flank pain and hematuria.  Musculoskeletal: Negative for myalgias.  Skin: Negative for rash.  Neurological: Negative for dizziness, tingling, tremors, sensory change, speech change, focal weakness, loss of consciousness, weakness and headaches.  Psychiatric/Behavioral: Negative for depression and substance abuse.     Objective:   Vitals: BP (!) 192/119 (BP Location: Left Arm)   Pulse (!) 103   Temp 98.6 F (37 C) (Oral)   Resp 18   LMP 05/02/2020   SpO2 98%   Wt Readings from Last 3 Encounters:  02/27/20 226 lb (102.5 kg)  02/23/20 220 lb (99.8 kg)   Temp Readings from Last 3 Encounters:  05/17/20 98.6 F (37 C) (Oral)  02/27/20 (!) 97.3 F (36.3 C) (Temporal)  02/23/20 98.1 F (36.7 C) (Oral)   BP Readings from Last 3 Encounters:  05/17/20 (!) 192/119  02/27/20 (!) 174/124  02/23/20 (!) 178/100   Pulse Readings from Last 3 Encounters:  05/17/20 (!) 103  02/27/20 90  02/23/20 88   Physical Exam Constitutional:      General: She is not in acute distress.    Appearance: Normal appearance. She is well-developed. She is obese. She is not ill-appearing, toxic-appearing or diaphoretic.  HENT:     Head: Normocephalic and atraumatic.     Nose: Nose normal.     Mouth/Throat:     Mouth:  Mucous membranes are moist.  Eyes:     Extraocular Movements: Extraocular movements intact.     Pupils: Pupils are equal, round, and reactive to light.  Cardiovascular:     Rate and Rhythm: Normal rate and regular rhythm.     Pulses: Normal pulses.     Heart sounds: Normal heart sounds. No murmur heard.  No friction rub. No gallop.   Pulmonary:     Effort: Pulmonary effort is normal. No respiratory distress.     Breath sounds: Normal breath sounds. No stridor. No wheezing, rhonchi or rales.  Musculoskeletal:        General:  Normal range of motion.     Right lower leg: No edema.     Left lower leg: No edema.  Skin:    General: Skin is warm and dry.     Findings: No rash.  Neurological:     Mental Status: She is alert and oriented to person, place, and time.     Cranial Nerves: No cranial nerve deficit.     Motor: No weakness.     Coordination: Coordination normal.     Gait: Gait normal.     Deep Tendon Reflexes: Reflexes normal.  Psychiatric:        Mood and Affect: Mood normal.        Behavior: Behavior normal.        Thought Content: Thought content normal.        Judgment: Judgment normal.      Assessment and Plan :   PDMP not reviewed this encounter.  1. Essential hypertension   2. Elevated blood pressure reading   3. Weight gain     Had extensive discussion with patient about severely elevated blood pressures and borderline tachycardia.  At this time patient does not have any physical exam findings of an acute intracranial process, ACS.  Discussed at length need for limiting physical exertion to avoid further increases in her blood pressure and strain that it may have been her heart, kidneys and brain.  Patient was agreeable to this.  Start amlodipine at 5 mg, titrate dose up to 10 mg over the the next 1 to 2 weeks.  Counseled on blood pressure checks.  She is to follow-up with [place] if she has not been able to establish care with a new PCP and her blood pressure remains elevated, monitoring parameters given.  Labs pending.  Strict ER precautions. Counseled patient on potential for adverse effects with medications prescribed today, patient verbalized understanding.    Jaynee Eagles, PA-C 05/17/20 1720

## 2020-05-17 NOTE — ED Triage Notes (Signed)
Pt presents with elevated blood pressure for awhile.

## 2020-05-18 ENCOUNTER — Ambulatory Visit: Payer: Managed Care, Other (non HMO) | Admitting: Plastic Surgery

## 2020-05-18 ENCOUNTER — Encounter: Payer: Self-pay | Admitting: Plastic Surgery

## 2020-05-18 VITALS — BP 178/119 | HR 95 | Temp 98.7°F

## 2020-05-18 DIAGNOSIS — L910 Hypertrophic scar: Secondary | ICD-10-CM | POA: Diagnosis not present

## 2020-05-18 NOTE — Progress Notes (Signed)
   Referring Provider No referring provider defined for this encounter.   CC:  Chief Complaint  Patient presents with  . Follow-up      Monique Kim is an 34 y.o. female.  HPI: Patient presents in follow-up for scar above her left eyebrow.  This happened with a ground-level fall in the shower about 3 months ago.  At that time she had been seen in the emergency room and Dermabond was applied and it was difficult to really tell much about the laceration due to all the scabbing.  She continues to be bothered by the scars appearance and wants to have it reevaluated.  Review of Systems General: Denies fevers or chills  Physical Exam Vitals with BMI 05/18/2020 05/17/2020 05/17/2020  Height - - -  Weight - - -  BMI - - -  Systolic 178 192 378  Diastolic 119 119 588  Pulse 95 - 103    General:  No acute distress,  Alert and oriented, Non-Toxic, Normal speech and affect Examination shows a slightly curvilinear mostly transverse scar about a centimeter superior to the left eyebrow and mostly parallel to the eyebrow.  The scar is a bit thick with some contour irregularities.  There is mild erythema in the scar itself.  Assessment/Plan Patient presents with a bothersome scar in the left forehead area.  There is still some contour irregularities and the erythema of the scar makes it noticeable.  We discussed in detail the scar maturation process.  I did explain that I thought it would be too early to do a scar revision and would be hopeful that with more time the scar appearance would improve.  She is already using a silicone scar cream and a sunscreen.  I will plan to see her again in another 3 months but discussed the various options to improve the color and contour in this area should they be required.  [name] 05/18/2020, 2:07 PM

## 2020-08-16 ENCOUNTER — Ambulatory Visit: Payer: Managed Care, Other (non HMO) | Admitting: Plastic Surgery

## 2021-06-09 ENCOUNTER — Encounter (HOSPITAL_COMMUNITY): Payer: Self-pay

## 2021-06-09 ENCOUNTER — Emergency Department (HOSPITAL_COMMUNITY): Payer: Managed Care, Other (non HMO)

## 2021-06-09 ENCOUNTER — Other Ambulatory Visit: Payer: Self-pay

## 2021-06-09 ENCOUNTER — Emergency Department (HOSPITAL_COMMUNITY)
Admission: EM | Admit: 2021-06-09 | Discharge: 2021-06-09 | Disposition: A | Discharge status: Home / Self Care | Payer: Managed Care, Other (non HMO) | Attending: Emergency Medicine | Admitting: Emergency Medicine

## 2021-06-09 DIAGNOSIS — Y93K1 Activity, walking an animal: Secondary | ICD-10-CM | POA: Insufficient documentation

## 2021-06-09 DIAGNOSIS — S62114A Nondisplaced fracture of triquetrum [cuneiform] bone, right wrist, initial encounter for closed fracture: Secondary | ICD-10-CM

## 2021-06-09 DIAGNOSIS — W19XXXA Unspecified fall, initial encounter: Secondary | ICD-10-CM | POA: Insufficient documentation

## 2021-06-09 DIAGNOSIS — S6991XA Unspecified injury of right wrist, hand and finger(s), initial encounter: Secondary | ICD-10-CM | POA: Diagnosis present

## 2021-06-09 MED ORDER — ONDANSETRON 4 MG PO TBDP
4.0000 mg | ORAL_TABLET | Freq: Once | ORAL | Status: AC
  Administered 2021-06-09: 4 mg via ORAL
  Filled 2021-06-09: qty 1

## 2021-06-09 MED ORDER — ONDANSETRON 4 MG PO TBDP: 4.0000 mg | ORAL_TABLET | Freq: Three times a day (TID) | ORAL | 0 refills | Status: AC | PRN

## 2021-06-09 MED ORDER — HYDROCODONE-ACETAMINOPHEN 5-325 MG PO TABS
1.0000 | ORAL_TABLET | Freq: Once | ORAL | Status: AC
  Administered 2021-06-09: 1 via ORAL
  Filled 2021-06-09: qty 1

## 2021-06-09 MED ORDER — HYDROCODONE-ACETAMINOPHEN 5-325 MG PO TABS: 1.0000 | ORAL_TABLET | Freq: Four times a day (QID) | ORAL | 0 refills | Status: AC | PRN

## 2021-06-09 MED ORDER — OXYCODONE-ACETAMINOPHEN 5-325 MG PO TABS: 1.0000 | ORAL_TABLET | Freq: Once | ORAL | Status: DC

## 2021-06-09 NOTE — ED Provider Notes (Signed)
[facility]-EMERGENCY DEPT Provider Note   CSN: [id number] Arrival date & time: 06/09/21  2004     History Chief Complaint  Patient presents with   Wrist Injury    Monique Kim is a 35 y.o. female who presents for evaluation of right wrist pain after mechanical fall that occurred earlier this evening.  She reports that she was walking her dog and states that they talk to her, causing her to fall with her arm outstretched and landed on her right hand.  Since then she has had pain to her right wrist.  Hurts more when she tries to move it.  She has not had any numbness/weakness.  She did not hit her head or have any injury.  The history is provided by the patient.      History reviewed. No pertinent past medical history.  There are no problems to display for this patient.   History reviewed. No pertinent surgical history.   OB History   No obstetric history on file.     Family History  Family history unknown: Yes    Social History   Tobacco Use   Smoking status: Never   Smokeless tobacco: Never    Home Medications Prior to Admission medications   Medication Sig Start Date End Date Taking? Authorizing Provider  HYDROcodone-acetaminophen (NORCO/VICODIN) 5-325 MG tablet Take 1 tablet by mouth every 6 (six) hours as needed. 06/09/21  Yes Graciella Freer A, PA-C  ondansetron (ZOFRAN ODT) 4 MG disintegrating tablet Take 1 tablet (4 mg total) by mouth every 8 (eight) hours as needed for nausea or vomiting. 06/09/21  Yes Graciella Freer A, PA-C  amLODipine (NORVASC) 5 MG tablet Take 1 tablet (5 mg total) by mouth daily. 05/17/20   Wallis Bamberg, PA-C  clonazePAM (KLONOPIN) 0.5 MG tablet Take 0.5 mg by mouth daily as needed. 10/13/19   [provider]  Drospirenone (SLYND) 4 MG TABS Take 1 tablet by mouth daily.    [provider]  Multiple Vitamin (MULTIVITAMIN) tablet Take 2 tablets by mouth daily.    [provider]  VIIBRYD 20  MG TABS Take 30mg  tablet in the morning. 02/06/20   [provider]    Allergies    Patient has no known allergies.  Review of Systems   Review of Systems  Musculoskeletal:        Wrist pain  Neurological:  Negative for weakness and numbness.  All other systems reviewed and are negative.  Physical Exam Updated Vital Signs BP (!) 154/104   Pulse 82   Temp 99.2 F (37.3 C) (Oral)   Resp 15   Ht 5\' 9"  (1.753 m)   Wt 113.4 kg   SpO2 99%   BMI 36.92 kg/m   Physical Exam Vitals and nursing note reviewed.  Constitutional:      Appearance: She is well-developed.  HENT:     Head: Normocephalic and atraumatic.  Eyes:     General: No scleral icterus.       Right eye: No discharge.        Left eye: No discharge.     Conjunctiva/sclera: Conjunctivae normal.  Cardiovascular:     Pulses:          Radial pulses are 2+ on the right side and 2+ on the left side.  Pulmonary:     Effort: Pulmonary effort is normal.  Musculoskeletal:     Comments: Tenderness palpation noted to the dorsal aspect of the right wrist, particularly at  the ulnar aspect.  She also has some tenderness overlying the medial portion of her dorsal right wrist.  There is some mild overlying soft tissue swelling.  No snuffbox tenderness.  She can fully make a fist and flex and extend all 5 digits but does so with pain.  No tenderness in the right forearm, elbow.  No tenderness palpation of the left wrist.  He can easily make a fist with any difficulty.  Skin:    General: Skin is warm and dry.     Capillary Refill: Capillary refill takes less than 2 seconds.     Comments: Good distal cap refill. RUE is not dusky in appearance or cool to touch.  Neurological:     Mental Status: She is alert.     Comments: Sensation intact along major nerve distributions of RUE  Psychiatric:        Speech: Speech normal.        Behavior: Behavior normal.    ED Results / Procedures / Treatments   Labs (all labs ordered are  listed, but only abnormal results are displayed) Labs Reviewed - No data to display  EKG None  Radiology DG Wrist Complete Right  Result Date: 06/09/2021 CLINICAL DATA:  35 year old female with trauma to the right wrist. EXAM: RIGHT WRIST - COMPLETE 3+ VIEW COMPARISON:  None. FINDINGS: There is a nondisplaced fracture of the triquetrum. No other acute fracture. There is no dislocation. The bones are well mineralized. No arthritic changes. Mild soft tissue swelling of the dorsum of the wrist. No radiopaque foreign object or soft tissue gas. IMPRESSION: Small triquetral fracture. Electronically Signed   By: Elgie Collard M.D.   On: 06/09/2021 20:32    Procedures Procedures   Medications Ordered in ED Medications  oxyCODONE-acetaminophen (PERCOCET/ROXICET) 5-325 MG per tablet 1 tablet (1 tablet Oral Not Given 06/09/21 2308)  ondansetron (ZOFRAN-ODT) disintegrating tablet 4 mg (4 mg Oral Given 06/09/21 2155)  HYDROcodone-acetaminophen (NORCO/VICODIN) 5-325 MG per tablet 1 tablet (1 tablet Oral Given 06/09/21 2155)    ED Course  I have reviewed the triage vital signs and the nursing notes.  Pertinent labs & imaging results that were available during my care of the patient were reviewed by me and considered in my medical decision making (see chart for details).    MDM Rules/Calculators/A&P                          35 year old female who presents for evaluation of right wrist pain after mechanical fall.  Reports landing with her arm outstretched.  No head injury, LOC.  Now with pain noted to the right wrist.  On initial arrival, she is afebrile, toxic appearing.  Vital signs are stable.  She is neurovascularly intact.  She does have pain into the ulnar aspect as well as the dorsal aspect of the right wrist with some mild overlying soft tissue swelling.  She can fully flex and extend all 5 digits on hand can easily make a fist.  Concern for sprain versus fracture.  X-rays ordered at  triage.  X-ray shows a small triquetral fracture.  Will place patient in a volar splint.  I discussed results with patient.  Reevaluation after splint placement.  Patient with good distal cap refill, sensation.  Will give short course of pain medication for acute/breakthrough pain.  Patient instructed to follow-up with referred hand doctor. At this time, patient exhibits no emergent life-threatening condition that require further evaluation in  ED. Patient had ample opportunity for questions and discussion. All patient's questions were answered with full understanding. Strict return precautions discussed. Patient expresses understanding and agreement to plan.   Portions of this note were generated with Scientist, clinical (histocompatibility and immunogenetics). Dictation errors may occur despite best attempts at proofreading.   Final Clinical Impression(s) / ED Diagnoses Final diagnoses:  Closed nondisplaced fracture of triquetrum of right wrist, initial encounter    Rx / DC Orders ED Discharge Orders          Ordered    HYDROcodone-acetaminophen (NORCO/VICODIN) 5-325 MG tablet  Every 6 hours PRN        06/09/21 2255    ondansetron (ZOFRAN ODT) 4 MG disintegrating tablet  Every 8 hours PRN        06/09/21 2255             Maxwell Caul, PA-C 06/09/21 2348    Tegeler, Canary Brim, MD 06/09/21 2349

## 2021-06-09 NOTE — ED Triage Notes (Signed)
Pt was playing with her dog this evening and fell on her right wrist (catching herself).

## 2021-06-09 NOTE — Discharge Instructions (Addendum)
Your blood pressure today was slightly high.  This is likely because you were in pain.  Please just have this followed up with your primary care doctor.  You can take Tylenol or Ibuprofen as directed for pain. You can alternate Tylenol and Ibuprofen every 4 hours. If you take Tylenol at 1pm, then you can take Ibuprofen at 5pm. Then you can take Tylenol again at 9pm.   You can take 1000 mg of Tylenol.  Do not exceed 4000 mg of Tylenol a day.  Take pain medications as directed for break through pain. Do not drive or operate machinery while taking this medication.   Take zofran for nausea.   Do not get the splint wet.  Keep arm elevated to help with swelling.  Please call the orthopedic doctor tomorrow to arrange follow-up appointment.  Return to emergency department any worsening pain, discoloration of the fingers, numbness of the fingers or any other worsening or concerning symptoms.

## 2021-06-10 NOTE — Progress Notes (Signed)
Orthopedic Tech Progress Note Patient Details:  Monique Kim 1986-09-28 [id number]  Ortho Devices Type of Ortho Device: Volar splint, Arm sling Ortho Device/Splint Location: rue Ortho Device/Splint Interventions: Ordered, Application, Adjustment   Post Interventions Patient Tolerated: Well Instructions Provided: Care of device, Adjustment of device  Trinna Post 06/10/2021, 6:25 AM

## 2022-06-28 IMAGING — DX DG WRIST COMPLETE 3+V*R*
4 series · 4 of 4 positions shown · non-contrast
Comparison: None.

CLINICAL DATA: 34-year-old female with trauma to the right wrist.

EXAM:
RIGHT WRIST - COMPLETE 3+ VIEW

[wrist ap (1 of 2)]
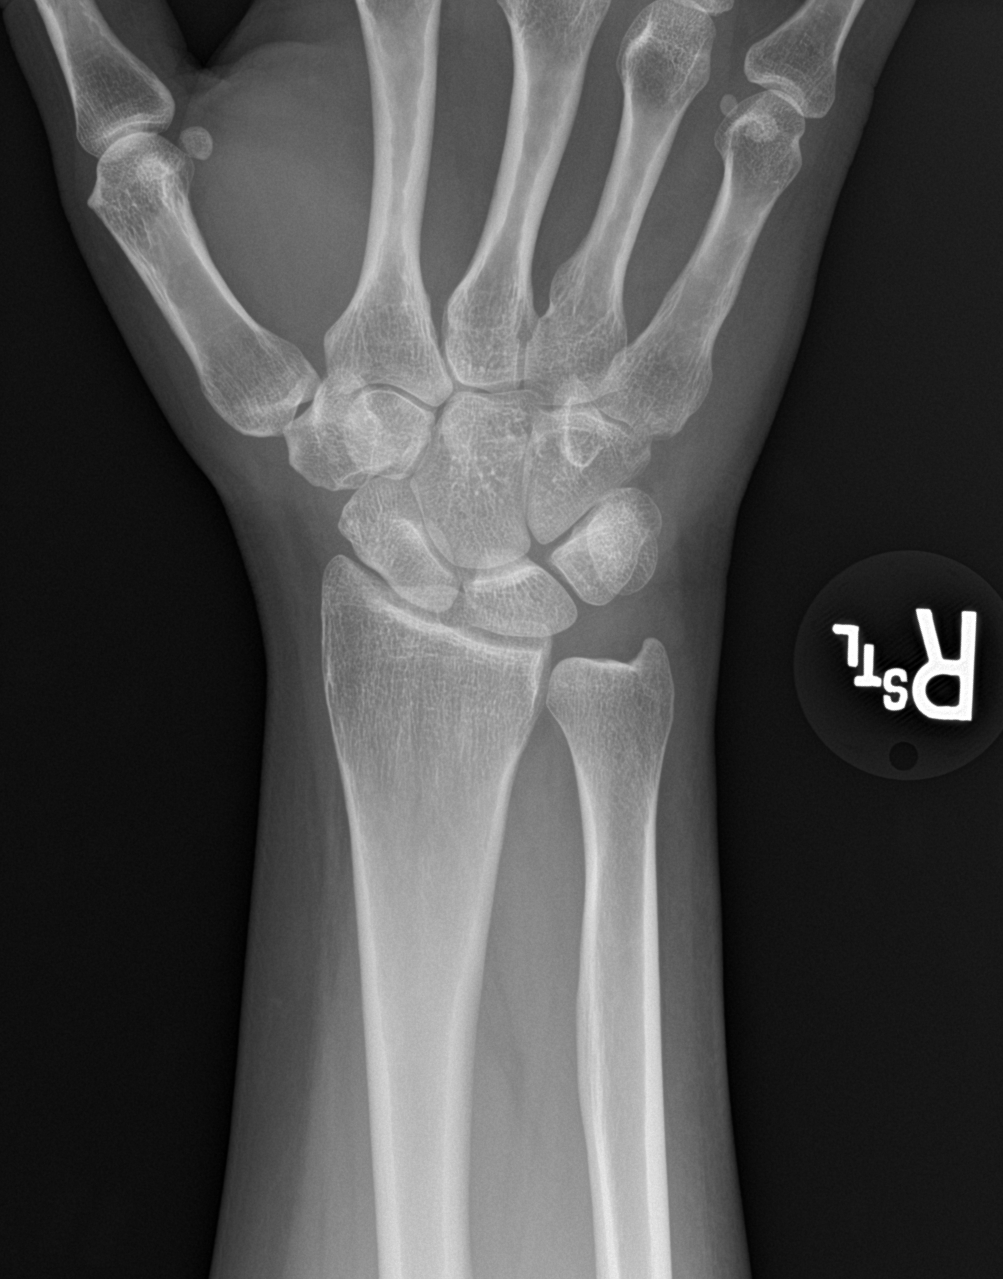

[wrist obl]
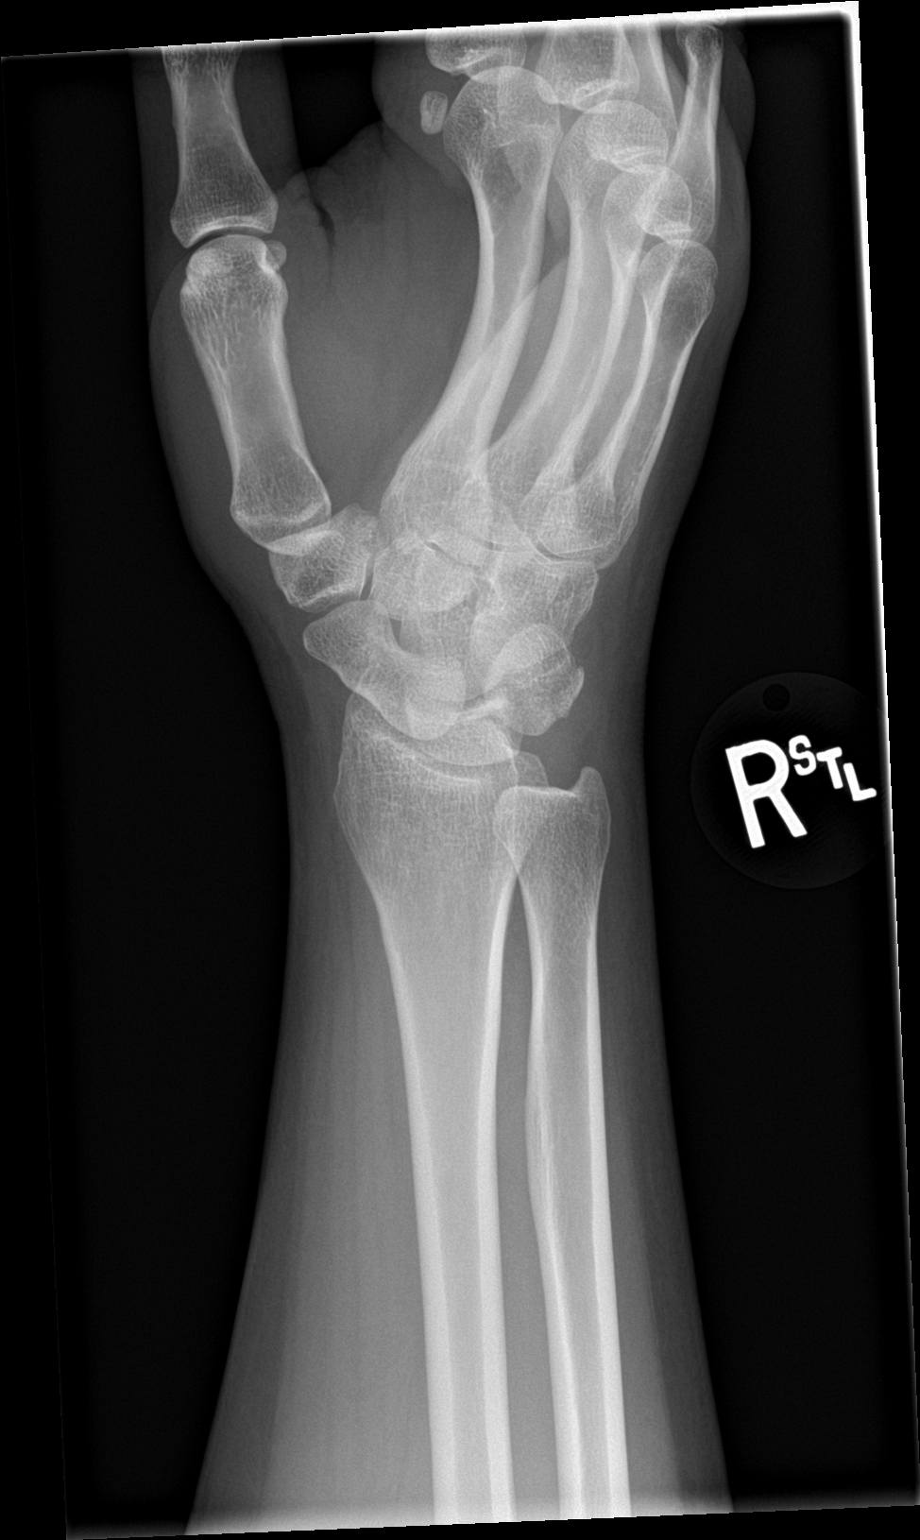

[wrist lat]
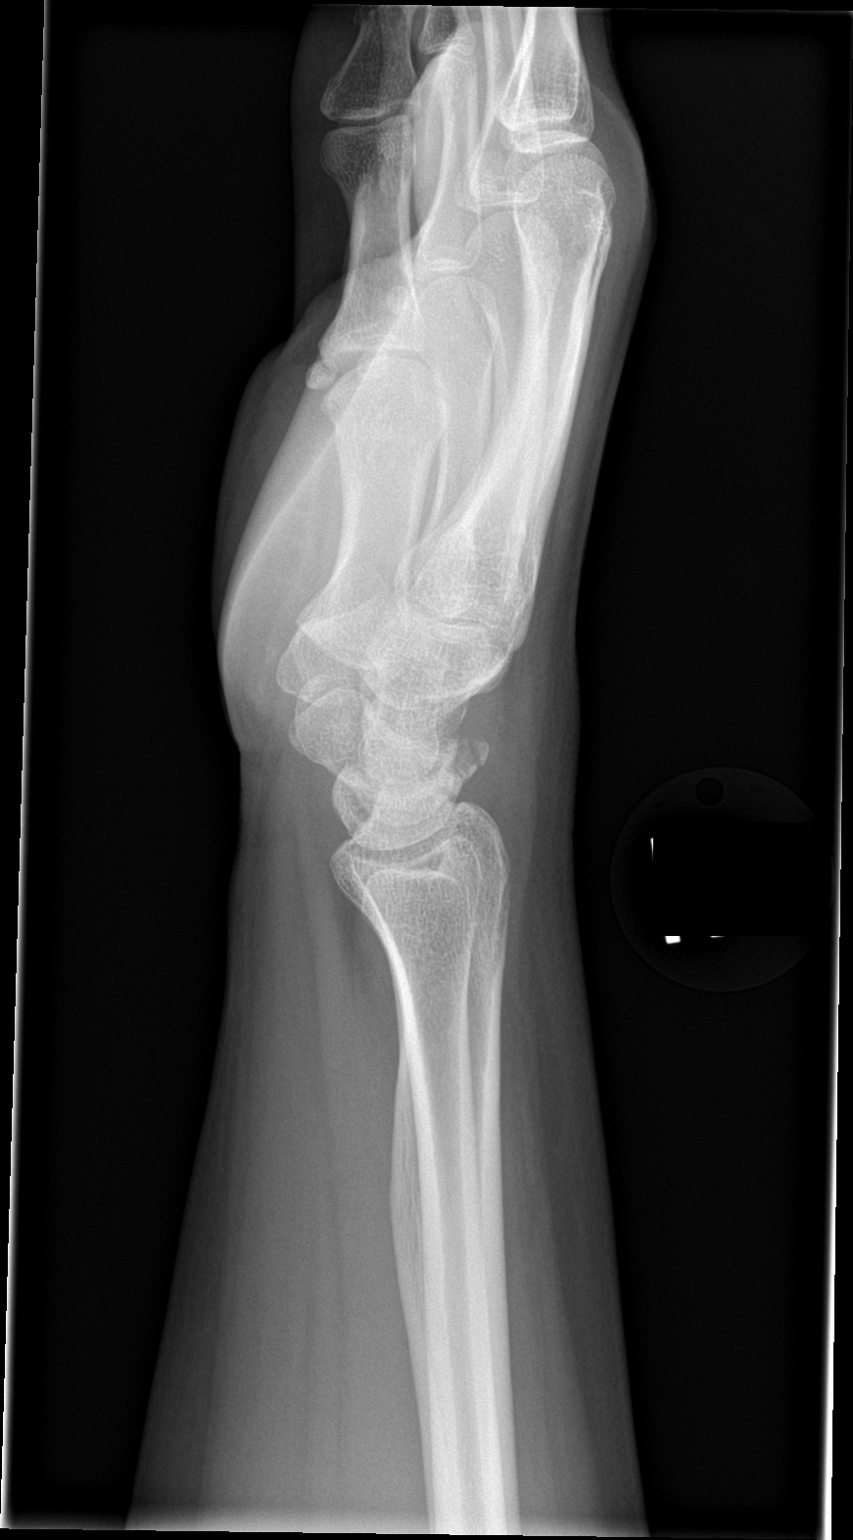

[wrist ap (2 of 2)]
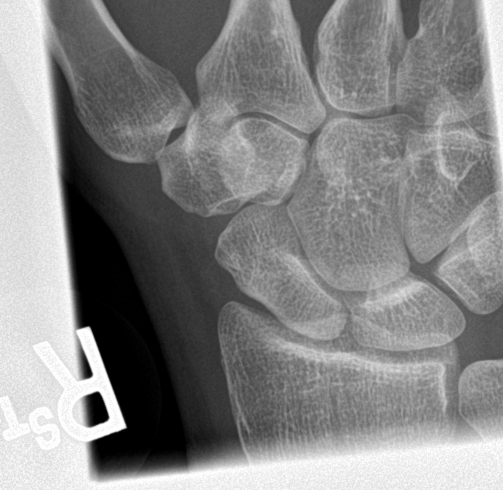

[4 of 4 positions shown; findings below may reference images not displayed]

FINDINGS: There is a nondisplaced fracture of the triquetrum. No other acute
fracture. There is no dislocation. The bones are well mineralized.
No arthritic changes. Mild soft tissue swelling of the dorsum of the
wrist. No radiopaque foreign object or soft tissue gas.
IMPRESSION: Small triquetral fracture.

## 2022-12-04 DIAGNOSIS — F909 Attention-deficit hyperactivity disorder, unspecified type: Secondary | ICD-10-CM | POA: Diagnosis not present

## 2022-12-04 DIAGNOSIS — R69 Illness, unspecified: Secondary | ICD-10-CM | POA: Diagnosis not present

## 2023-03-11 DIAGNOSIS — Z79899 Other long term (current) drug therapy: Secondary | ICD-10-CM | POA: Diagnosis not present

## 2023-03-11 DIAGNOSIS — E7800 Pure hypercholesterolemia, unspecified: Secondary | ICD-10-CM | POA: Diagnosis not present

## 2023-05-12 DIAGNOSIS — F411 Generalized anxiety disorder: Secondary | ICD-10-CM | POA: Diagnosis not present

## 2023-05-12 DIAGNOSIS — F909 Attention-deficit hyperactivity disorder, unspecified type: Secondary | ICD-10-CM | POA: Diagnosis not present

## 2023-05-15 DIAGNOSIS — Z23 Encounter for immunization: Secondary | ICD-10-CM | POA: Diagnosis not present

## 2023-05-15 DIAGNOSIS — E7800 Pure hypercholesterolemia, unspecified: Secondary | ICD-10-CM | POA: Diagnosis not present

## 2023-05-15 DIAGNOSIS — Z0000 Encounter for general adult medical examination without abnormal findings: Secondary | ICD-10-CM | POA: Diagnosis not present

## 2023-05-15 DIAGNOSIS — R945 Abnormal results of liver function studies: Secondary | ICD-10-CM | POA: Diagnosis not present

## 2023-11-09 ENCOUNTER — Other Ambulatory Visit (HOSPITAL_COMMUNITY): Payer: Self-pay

## 2023-11-09 MED ORDER — LISDEXAMFETAMINE DIMESYLATE 70 MG PO CAPS
70.0000 mg | ORAL_CAPSULE | Freq: Every morning | ORAL | 0 refills | Status: AC
  Filled 2023-11-09: qty 30, 30d supply, fill #0

## 2023-11-09 MED ORDER — AMPHETAMINE-DEXTROAMPHETAMINE 10 MG PO TABS
10.0000 mg | ORAL_TABLET | Freq: Every day | ORAL | 0 refills | Status: AC
  Filled 2023-11-09: qty 30, 30d supply, fill #0

## 2023-11-10 ENCOUNTER — Other Ambulatory Visit (HOSPITAL_COMMUNITY): Payer: Self-pay

## 2023-11-10 MED ORDER — AMPHETAMINE-DEXTROAMPHETAMINE 10 MG PO TABS
10.0000 mg | ORAL_TABLET | Freq: Every day | ORAL | 0 refills | Status: AC
  Filled 2023-12-11: qty 30, 30d supply, fill #0

## 2023-11-10 MED ORDER — AMPHETAMINE-DEXTROAMPHETAMINE 10 MG PO TABS
10.0000 mg | ORAL_TABLET | Freq: Every day | ORAL | 0 refills | Status: DC
  Filled 2024-01-11: qty 30, 30d supply, fill #0

## 2023-11-10 MED ORDER — LISDEXAMFETAMINE DIMESYLATE 70 MG PO CAPS
70.0000 mg | ORAL_CAPSULE | Freq: Every morning | ORAL | 0 refills | Status: AC
  Filled 2023-12-11: qty 30, 30d supply, fill #0

## 2023-11-10 MED ORDER — LISDEXAMFETAMINE DIMESYLATE 70 MG PO CAPS
70.0000 mg | ORAL_CAPSULE | Freq: Every morning | ORAL | 0 refills | Status: AC
  Filled 2024-01-07 – 2024-01-08 (×2): qty 30, 30d supply, fill #0

## 2023-12-11 ENCOUNTER — Other Ambulatory Visit (HOSPITAL_COMMUNITY): Payer: Self-pay

## 2024-01-07 ENCOUNTER — Other Ambulatory Visit (HOSPITAL_COMMUNITY): Payer: Self-pay

## 2024-01-08 ENCOUNTER — Other Ambulatory Visit: Payer: Self-pay

## 2024-01-08 ENCOUNTER — Other Ambulatory Visit (HOSPITAL_COMMUNITY): Payer: Self-pay

## 2024-01-11 ENCOUNTER — Other Ambulatory Visit (HOSPITAL_COMMUNITY): Payer: Self-pay

## 2024-02-10 ENCOUNTER — Other Ambulatory Visit (HOSPITAL_COMMUNITY): Payer: Self-pay

## 2024-02-11 ENCOUNTER — Other Ambulatory Visit (HOSPITAL_COMMUNITY): Payer: Self-pay

## 2024-02-11 MED ORDER — LISDEXAMFETAMINE DIMESYLATE 70 MG PO CAPS
70.0000 mg | ORAL_CAPSULE | Freq: Every morning | ORAL | 0 refills | Status: DC
  Filled 2024-02-11: qty 30, 30d supply, fill #0

## 2024-03-17 ENCOUNTER — Other Ambulatory Visit: Payer: Self-pay

## 2024-03-17 ENCOUNTER — Other Ambulatory Visit (HOSPITAL_COMMUNITY): Payer: Self-pay

## 2024-03-17 MED ORDER — AMPHETAMINE-DEXTROAMPHETAMINE 10 MG PO TABS
10.0000 mg | ORAL_TABLET | Freq: Every day | ORAL | 0 refills | Status: AC
  Filled 2024-03-17: qty 30, 30d supply, fill #0

## 2024-03-17 MED ORDER — LISDEXAMFETAMINE DIMESYLATE 70 MG PO CAPS
70.0000 mg | ORAL_CAPSULE | Freq: Every morning | ORAL | 0 refills | Status: AC
  Filled 2024-03-17: qty 30, 30d supply, fill #0

## 2024-04-19 ENCOUNTER — Other Ambulatory Visit (HOSPITAL_COMMUNITY): Payer: Self-pay

## 2024-04-19 MED ORDER — LISDEXAMFETAMINE DIMESYLATE 70 MG PO CAPS
70.0000 mg | ORAL_CAPSULE | Freq: Every morning | ORAL | 0 refills | Status: AC
  Filled 2024-04-19: qty 30, 30d supply, fill #0

## 2024-04-20 ENCOUNTER — Other Ambulatory Visit (HOSPITAL_COMMUNITY): Payer: Self-pay

## 2024-05-16 ENCOUNTER — Other Ambulatory Visit (HOSPITAL_COMMUNITY): Payer: Self-pay

## 2024-05-16 MED ORDER — AMPHETAMINE-DEXTROAMPHETAMINE 10 MG PO TABS
ORAL_TABLET | ORAL | 0 refills | Status: AC
  Filled 2024-05-16 – 2024-05-26 (×2): qty 30, 30d supply, fill #0

## 2024-05-23 ENCOUNTER — Other Ambulatory Visit (HOSPITAL_COMMUNITY): Payer: Self-pay

## 2024-05-23 MED ORDER — LISDEXAMFETAMINE DIMESYLATE 70 MG PO CAPS
70.0000 mg | ORAL_CAPSULE | Freq: Every morning | ORAL | 0 refills | Status: AC
  Filled 2024-05-23: qty 30, 30d supply, fill #0

## 2024-05-26 ENCOUNTER — Other Ambulatory Visit (HOSPITAL_COMMUNITY): Payer: Self-pay

## 2024-05-26 MED ORDER — LISDEXAMFETAMINE DIMESYLATE 70 MG PO CAPS
70.0000 mg | ORAL_CAPSULE | Freq: Every morning | ORAL | 0 refills | Status: AC
  Filled 2024-06-30: qty 30, 30d supply, fill #0

## 2024-05-26 MED ORDER — AMPHETAMINE-DEXTROAMPHETAMINE 10 MG PO TABS
10.0000 mg | ORAL_TABLET | Freq: Every day | ORAL | 0 refills | Status: AC
  Filled 2024-06-30: qty 30, 30d supply, fill #0

## 2024-05-26 MED ORDER — AMPHETAMINE-DEXTROAMPHETAMINE 10 MG PO TABS
10.0000 mg | ORAL_TABLET | Freq: Every day | ORAL | 0 refills | Status: DC
  Filled 2024-08-02: qty 30, 30d supply, fill #0

## 2024-05-26 MED ORDER — LISDEXAMFETAMINE DIMESYLATE 70 MG PO CAPS
70.0000 mg | ORAL_CAPSULE | Freq: Every morning | ORAL | 0 refills | Status: AC
  Filled 2024-08-02: qty 30, 30d supply, fill #0

## 2024-06-30 ENCOUNTER — Other Ambulatory Visit (HOSPITAL_COMMUNITY): Payer: Self-pay

## 2024-08-02 ENCOUNTER — Other Ambulatory Visit (HOSPITAL_COMMUNITY): Payer: Self-pay

## 2024-08-03 ENCOUNTER — Other Ambulatory Visit (HOSPITAL_COMMUNITY): Payer: Self-pay

## 2024-08-30 ENCOUNTER — Other Ambulatory Visit (HOSPITAL_COMMUNITY): Payer: Self-pay

## 2024-08-30 MED ORDER — AMPHETAMINE-DEXTROAMPHETAMINE 10 MG PO TABS
10.0000 mg | ORAL_TABLET | Freq: Every day | ORAL | 0 refills | Status: DC
  Filled 2024-08-30: qty 30, 30d supply, fill #0

## 2024-08-30 MED ORDER — LISDEXAMFETAMINE DIMESYLATE 70 MG PO CAPS
70.0000 mg | ORAL_CAPSULE | Freq: Every morning | ORAL | 0 refills | Status: DC
  Filled 2024-08-30: qty 30, 30d supply, fill #0

## 2024-08-31 ENCOUNTER — Other Ambulatory Visit (HOSPITAL_COMMUNITY): Payer: Self-pay

## 2024-09-02 ENCOUNTER — Other Ambulatory Visit (HOSPITAL_COMMUNITY): Payer: Self-pay

## 2024-09-30 ENCOUNTER — Other Ambulatory Visit (HOSPITAL_COMMUNITY): Payer: Self-pay

## 2024-09-30 ENCOUNTER — Other Ambulatory Visit: Payer: Self-pay

## 2024-09-30 MED ORDER — AMPHETAMINE-DEXTROAMPHETAMINE 10 MG PO TABS
10.0000 mg | ORAL_TABLET | Freq: Every day | ORAL | 0 refills | Status: AC
  Filled 2024-09-30: qty 30, 30d supply, fill #0

## 2024-09-30 MED ORDER — LISDEXAMFETAMINE DIMESYLATE 70 MG PO CAPS
70.0000 mg | ORAL_CAPSULE | Freq: Every morning | ORAL | 0 refills | Status: AC
  Filled 2024-09-30: qty 30, 30d supply, fill #0

## 2024-11-02 ENCOUNTER — Other Ambulatory Visit (HOSPITAL_COMMUNITY): Payer: Self-pay

## 2024-11-02 MED ORDER — AMPHETAMINE-DEXTROAMPHETAMINE 10 MG PO TABS
10.0000 mg | ORAL_TABLET | Freq: Every day | ORAL | 0 refills | Status: AC
  Filled 2024-11-02: qty 30, 30d supply, fill #0

## 2024-11-03 ENCOUNTER — Other Ambulatory Visit (HOSPITAL_COMMUNITY): Payer: Self-pay

## 2024-11-04 ENCOUNTER — Other Ambulatory Visit (HOSPITAL_COMMUNITY): Payer: Self-pay

## 2024-11-04 MED ORDER — LISDEXAMFETAMINE DIMESYLATE 70 MG PO CAPS
70.0000 mg | ORAL_CAPSULE | Freq: Every morning | ORAL | 0 refills | Status: AC
  Filled 2024-11-04: qty 30, 30d supply, fill #0

## 2024-11-08 ENCOUNTER — Other Ambulatory Visit (HOSPITAL_COMMUNITY): Payer: Self-pay

## 2024-11-14 ENCOUNTER — Other Ambulatory Visit (HOSPITAL_COMMUNITY): Payer: Self-pay

## 2024-11-14 MED ORDER — AMPHETAMINE-DEXTROAMPHETAMINE 10 MG PO TABS: 10.0000 mg | ORAL_TABLET | Freq: Every day | ORAL | 0 refills | Status: AC

## 2024-11-14 MED ORDER — CLONAZEPAM 0.5 MG PO TABS
0.5000 mg | ORAL_TABLET | Freq: Every day | ORAL | 1 refills | Status: AC
  Filled 2024-11-14 – 2024-12-07 (×2): qty 30, 30d supply, fill #0

## 2024-11-14 MED ORDER — LISDEXAMFETAMINE DIMESYLATE 70 MG PO CAPS
70.0000 mg | ORAL_CAPSULE | Freq: Every morning | ORAL | 0 refills | Status: AC
  Filled 2024-12-07 (×2): qty 30, 30d supply, fill #0

## 2024-11-14 MED ORDER — LISDEXAMFETAMINE DIMESYLATE 70 MG PO CAPS: 70.0000 mg | ORAL_CAPSULE | Freq: Every morning | ORAL | 0 refills | Status: AC

## 2024-11-14 MED ORDER — AMPHETAMINE-DEXTROAMPHETAMINE 10 MG PO TABS
10.0000 mg | ORAL_TABLET | Freq: Every day | ORAL | 0 refills | Status: AC
  Filled 2024-12-07: qty 30, 30d supply, fill #0

## 2024-11-15 ENCOUNTER — Other Ambulatory Visit (HOSPITAL_COMMUNITY): Payer: Self-pay

## 2024-11-23 ENCOUNTER — Other Ambulatory Visit (HOSPITAL_COMMUNITY): Payer: Self-pay

## 2024-12-07 ENCOUNTER — Other Ambulatory Visit: Payer: Self-pay

## 2024-12-07 ENCOUNTER — Other Ambulatory Visit (HOSPITAL_COMMUNITY): Payer: Self-pay

## 2024-12-07 MED ORDER — SLYND 4 MG PO TABS
1.0000 | ORAL_TABLET | Freq: Every day | ORAL | 3 refills | Status: DC
  Filled 2024-12-07: qty 84, 84d supply, fill #0

## 2024-12-07 MED ORDER — LO LOESTRIN FE 1 MG-10 MCG / 10 MCG PO TABS
1.0000 | ORAL_TABLET | Freq: Every day | ORAL | 3 refills | Status: DC
  Filled 2024-12-07: qty 84, 84d supply, fill #0

## 2024-12-08 ENCOUNTER — Other Ambulatory Visit (HOSPITAL_COMMUNITY): Payer: Self-pay

## 2024-12-08 ENCOUNTER — Other Ambulatory Visit: Payer: Self-pay

## 2024-12-08 MED ORDER — JUNEL FE 24 1-20 MG-MCG(24) PO TABS
1.0000 | ORAL_TABLET | Freq: Every day | ORAL | 3 refills | Status: AC
  Filled 2024-12-08: qty 84, 84d supply, fill #0

## 2024-12-08 MED ORDER — JUNEL FE 24 1-20 MG-MCG(24) PO TABS: 1.0000 | ORAL_TABLET | Freq: Every day | ORAL | 0 refills | Status: AC

## 2024-12-09 ENCOUNTER — Other Ambulatory Visit: Payer: Self-pay

## 2024-12-09 ENCOUNTER — Other Ambulatory Visit (HOSPITAL_COMMUNITY): Payer: Self-pay

## 2024-12-09 MED ORDER — HYDROCHLOROTHIAZIDE 25 MG PO TABS
25.0000 mg | ORAL_TABLET | Freq: Every morning | ORAL | 3 refills | Status: AC
  Filled 2024-12-09: qty 90, 90d supply, fill #0

## 2024-12-09 MED ORDER — AMLODIPINE BESYLATE 10 MG PO TABS
10.0000 mg | ORAL_TABLET | Freq: Every day | ORAL | 3 refills | Status: AC
  Filled 2024-12-09: qty 90, 90d supply, fill #0
